# Patient Record
Sex: Male | Born: 2000 | Hispanic: Yes | Marital: Single | Smoking: Never smoker
Health system: Southern US, Community
[De-identification: ages and names within clinical notes are randomized; demographics above are authoritative.]

## PROBLEM LIST (undated history)

## (undated) DIAGNOSIS — F319 Bipolar disorder, unspecified: Secondary | ICD-10-CM

## (undated) DIAGNOSIS — F909 Attention-deficit hyperactivity disorder, unspecified type: Secondary | ICD-10-CM

## (undated) DIAGNOSIS — J45909 Unspecified asthma, uncomplicated: Secondary | ICD-10-CM

## (undated) HISTORY — PX: TONSILLECTOMY: SUR1361

## (undated) HISTORY — PX: INGUINAL HERNIA REPAIR: SUR1180

## (undated) HISTORY — PX: WISDOM TOOTH EXTRACTION: SHX21

---
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
## 2020-06-30 IMAGING — CR DG CHEST 2V
2 series · 2 of 2 positions shown · non-contrast
Comparison: May 12, 2014

CLINICAL DATA: Cough and emesis

EXAM:
CHEST - 2 VIEW

[w chest pa]
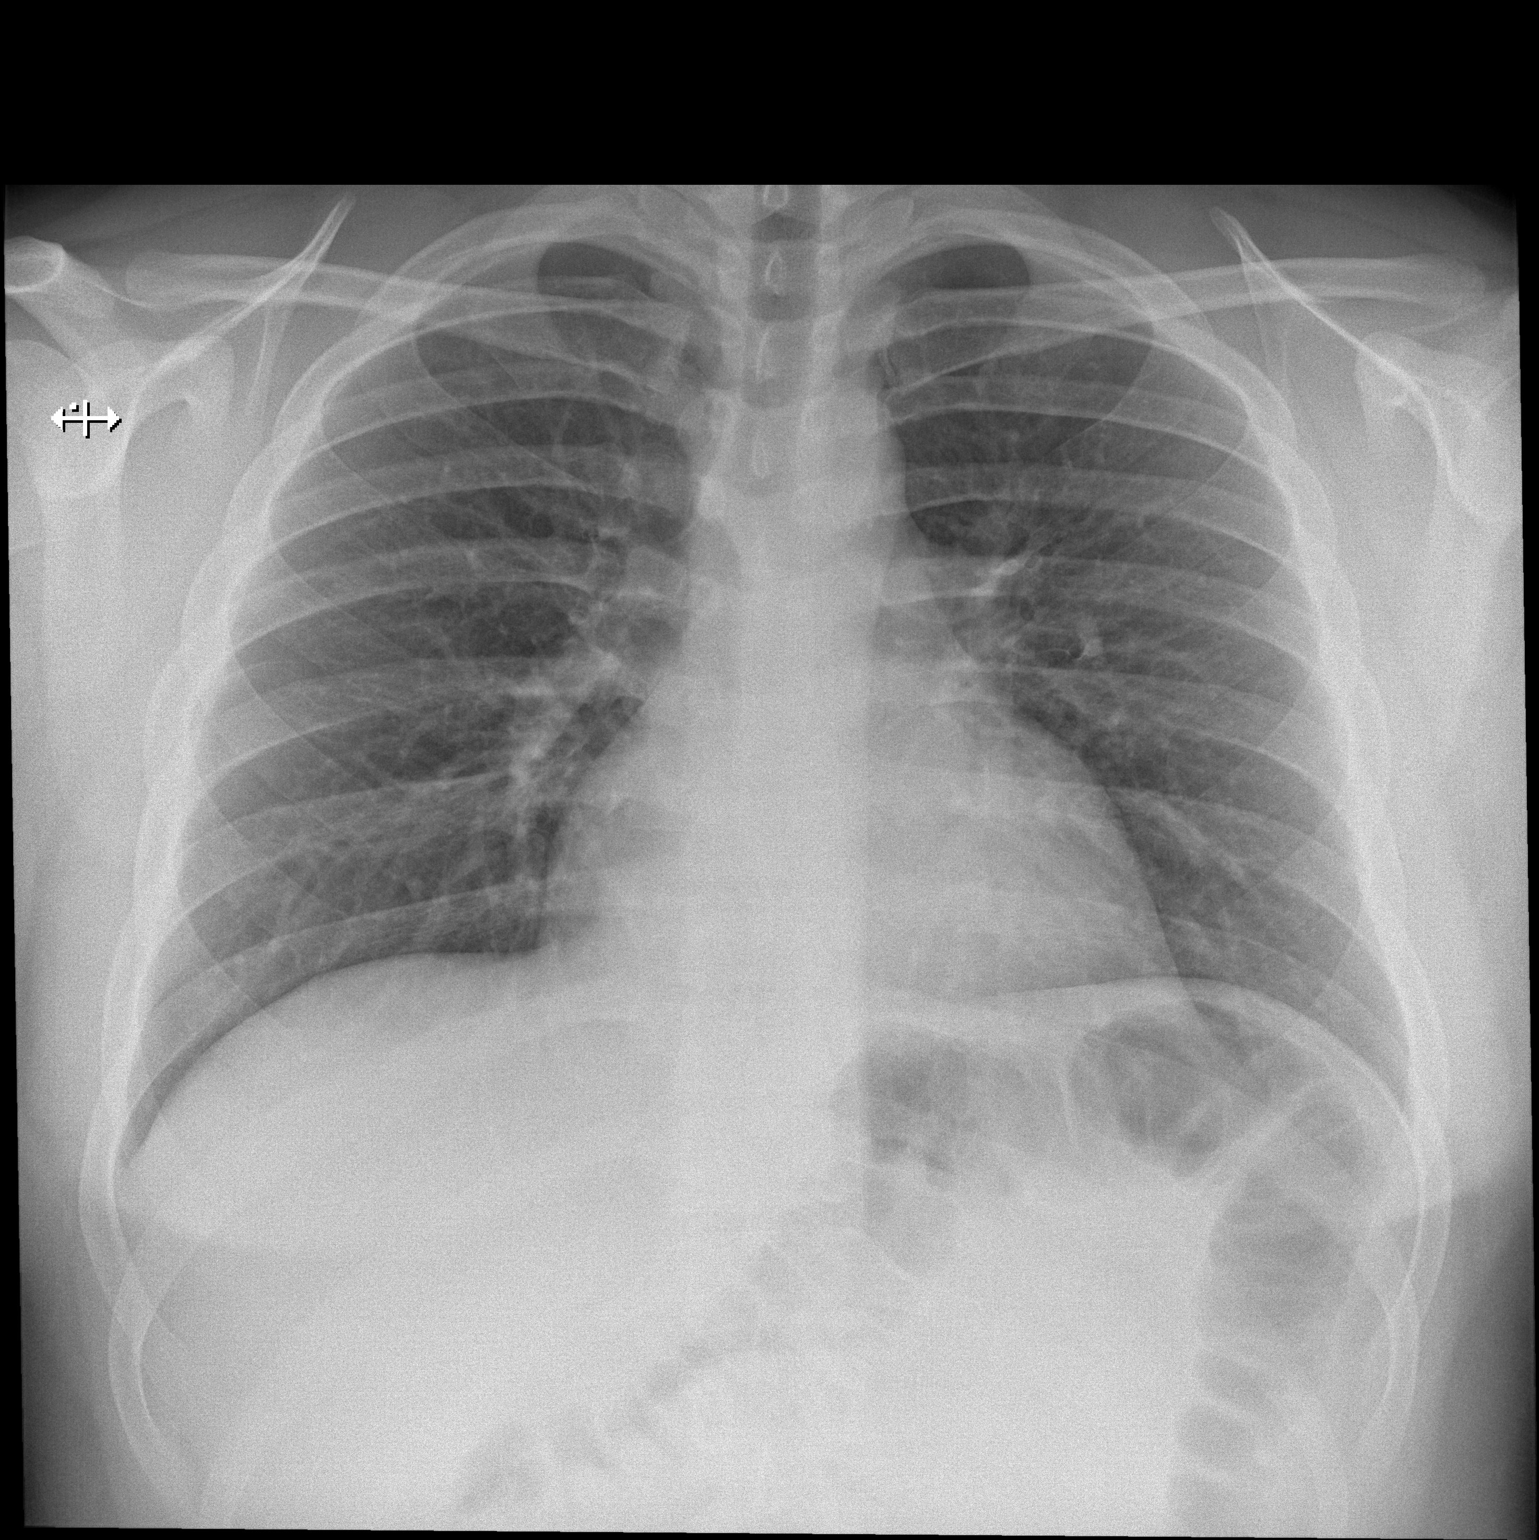

[w chest lat]
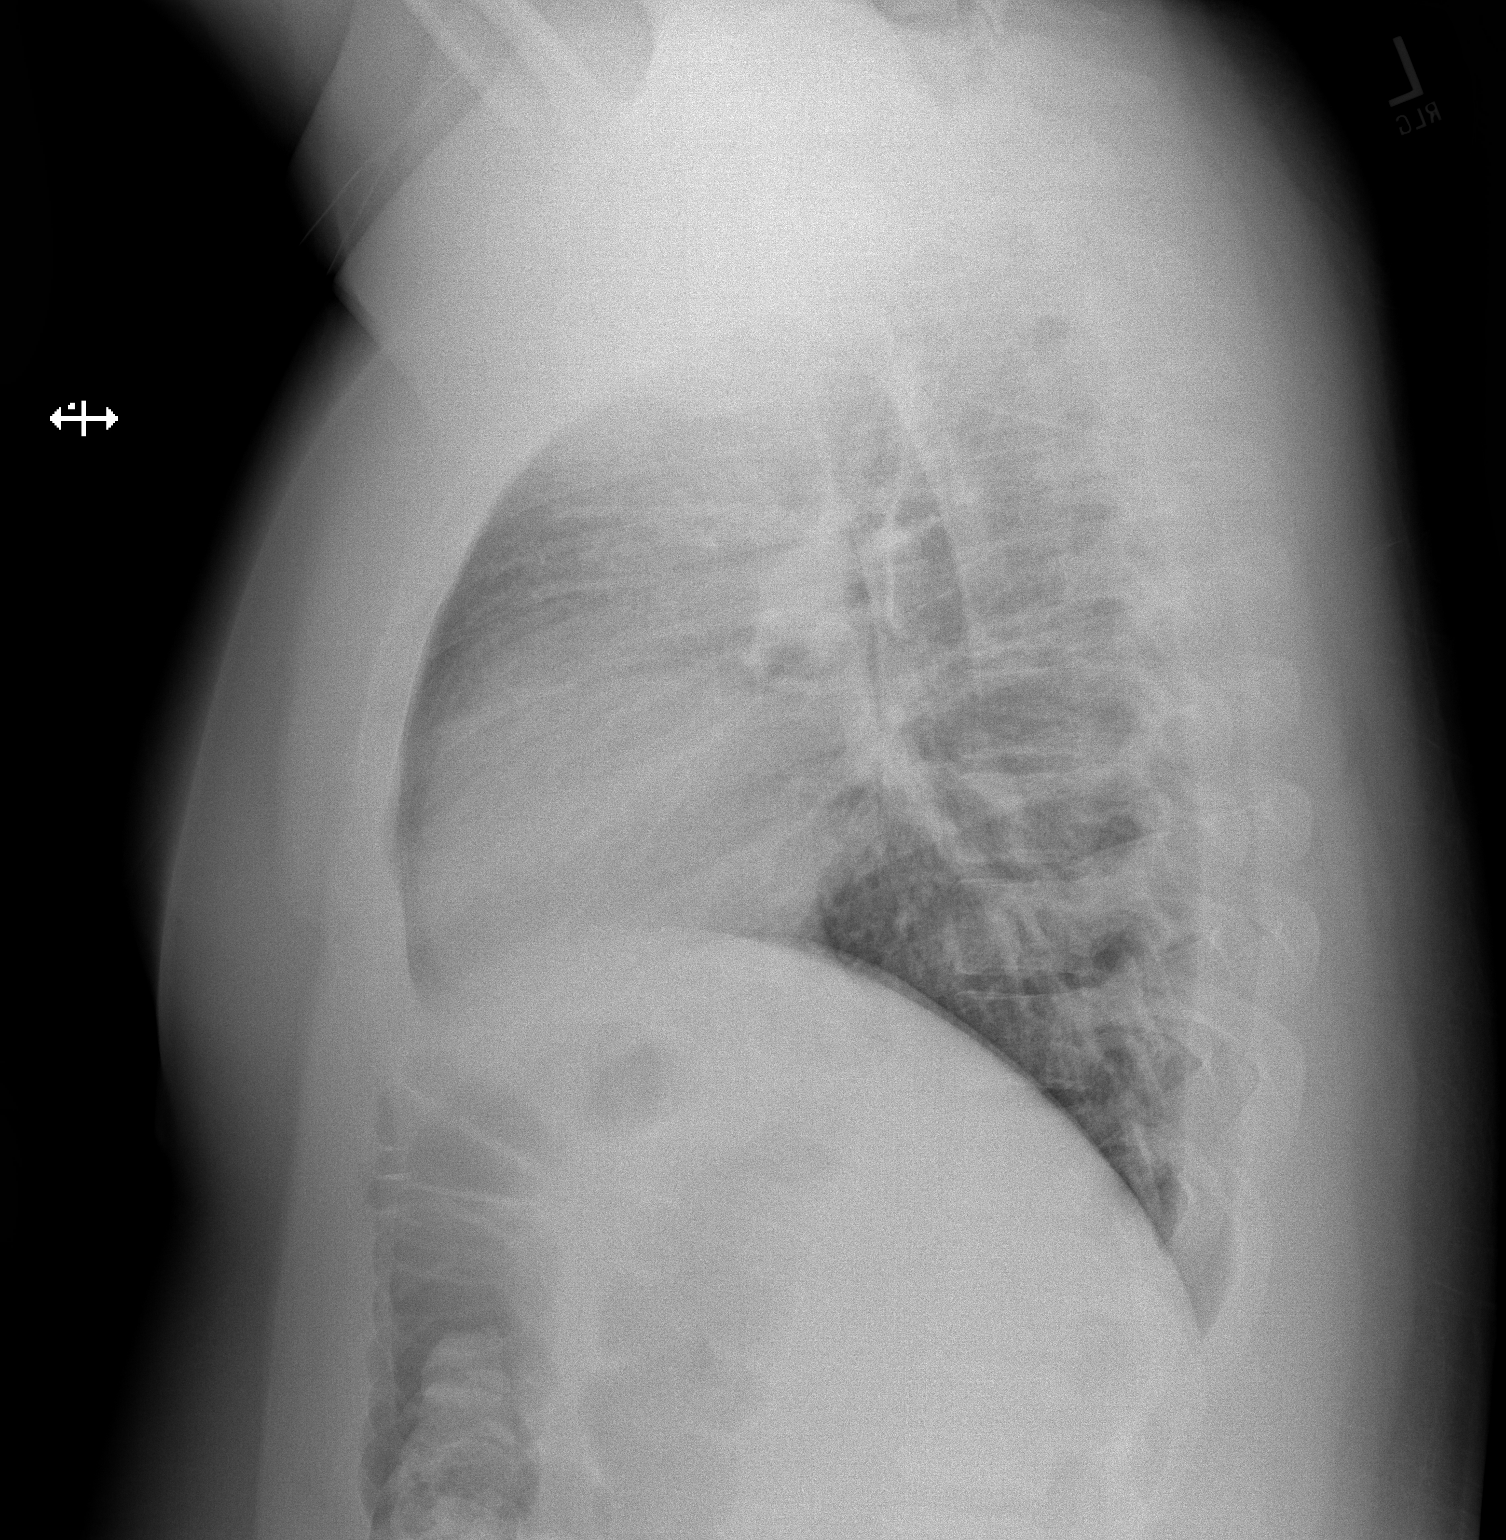

[2 of 2 positions shown; findings below may reference images not displayed]

FINDINGS: The lungs are clear. The heart size and pulmonary vascularity are
normal. No adenopathy. No bone lesions.
IMPRESSION: No edema or consolidation.

## 2021-01-31 ENCOUNTER — Other Ambulatory Visit: Payer: Self-pay

## 2021-01-31 ENCOUNTER — Encounter (HOSPITAL_BASED_OUTPATIENT_CLINIC_OR_DEPARTMENT_OTHER): Payer: Self-pay | Admitting: Emergency Medicine

## 2021-01-31 ENCOUNTER — Emergency Department (HOSPITAL_BASED_OUTPATIENT_CLINIC_OR_DEPARTMENT_OTHER)
Admission: EM | Admit: 2021-01-31 | Discharge: 2021-01-31 | Disposition: A | Discharge status: Home / Self Care | Payer: Medicaid Other | Attending: Emergency Medicine | Admitting: Emergency Medicine

## 2021-01-31 DIAGNOSIS — D72829 Elevated white blood cell count, unspecified: Secondary | ICD-10-CM | POA: Insufficient documentation

## 2021-01-31 DIAGNOSIS — Z7951 Long term (current) use of inhaled steroids: Secondary | ICD-10-CM | POA: Insufficient documentation

## 2021-01-31 DIAGNOSIS — R1084 Generalized abdominal pain: Secondary | ICD-10-CM | POA: Diagnosis present

## 2021-01-31 DIAGNOSIS — R112 Nausea with vomiting, unspecified: Secondary | ICD-10-CM | POA: Diagnosis not present

## 2021-01-31 DIAGNOSIS — J4540 Moderate persistent asthma, uncomplicated: Secondary | ICD-10-CM | POA: Insufficient documentation

## 2021-01-31 DIAGNOSIS — I10 Essential (primary) hypertension: Secondary | ICD-10-CM | POA: Insufficient documentation

## 2021-01-31 LAB — COMPREHENSIVE METABOLIC PANEL
ALT: 50 U/L — ABNORMAL HIGH (ref 0–44)
AST: 32 U/L (ref 15–41)
Albumin: 4.7 g/dL (ref 3.5–5.0)
Alkaline Phosphatase: 93 U/L (ref 38–126)
Anion gap: 9 (ref 5–15)
BUN: 13 mg/dL (ref 6–20)
CO2: 26 mmol/L (ref 22–32)
Calcium: 9.9 mg/dL (ref 8.9–10.3)
Chloride: 100 mmol/L (ref 98–111)
Creatinine, Ser: 0.92 mg/dL (ref 0.61–1.24)
GFR, Estimated: >60 mL/min (ref >60)
Glucose, Bld: 99 mg/dL (ref 70–99)
Potassium: 3.9 mmol/L (ref 3.5–5.1)
Sodium: 135 mmol/L (ref 135–145)
Total Bilirubin: 0.3 mg/dL (ref 0.3–1.2)
Total Protein: 8.5 g/dL — ABNORMAL HIGH (ref 6.5–8.1)

## 2021-01-31 LAB — CBC WITH DIFFERENTIAL/PLATELET
Abs Immature Granulocytes: 0.05 10*3/uL (ref 0.00–0.07)
Basophils Absolute: 0.1 10*3/uL (ref 0.0–0.1)
Basophils Relative: 1 %
Eosinophils Absolute: 0.4 10*3/uL (ref 0.0–0.5)
Eosinophils Relative: 3 %
HCT: 49.2 % (ref 39.0–52.0)
Hemoglobin: 16.8 g/dL (ref 13.0–17.0)
Immature Granulocytes: 0 %
Lymphocytes Relative: 38 %
Lymphs Abs: 5 10*3/uL — ABNORMAL HIGH (ref 0.7–4.0)
MCH: 30.8 pg (ref 26.0–34.0)
MCHC: 34.1 g/dL (ref 30.0–36.0)
MCV: 90.3 fL (ref 80.0–100.0)
Monocytes Absolute: 0.9 10*3/uL (ref 0.1–1.0)
Monocytes Relative: 7 %
Neutro Abs: 6.7 10*3/uL (ref 1.7–7.7)
Neutrophils Relative %: 51 %
Platelets: 321 10*3/uL (ref 150–400)
RBC: 5.45 MIL/uL (ref 4.22–5.81)
RDW: 12.9 % (ref 11.5–15.5)
WBC: 13.1 10*3/uL — ABNORMAL HIGH (ref 4.0–10.5)
nRBC: 0 % (ref 0.0–0.2)

## 2021-01-31 LAB — LIPASE, BLOOD: Lipase: 25 U/L (ref 11–51)

## 2021-01-31 MED ORDER — SODIUM CHLORIDE 0.9 % IV BOLUS
1000.0000 mL | Freq: Once | INTRAVENOUS | Status: AC
  Administered 2021-01-31: 1000 mL via INTRAVENOUS

## 2021-01-31 MED ORDER — ONDANSETRON 4 MG PO TBDP: 4.0000 mg | ORAL_TABLET | Freq: Three times a day (TID) | ORAL | 0 refills | Status: AC | PRN

## 2021-01-31 MED ORDER — PROCHLORPERAZINE EDISYLATE 10 MG/2ML IJ SOLN
10.0000 mg | Freq: Once | INTRAMUSCULAR | Status: AC
  Administered 2021-01-31: 10 mg via INTRAVENOUS
  Filled 2021-01-31: qty 2

## 2021-01-31 MED ORDER — ONDANSETRON 4 MG PO TBDP
4.0000 mg | ORAL_TABLET | Freq: Once | ORAL | Status: AC
  Administered 2021-01-31: 4 mg via ORAL
  Filled 2021-01-31: qty 1

## 2021-01-31 MED ORDER — ALUM & MAG HYDROXIDE-SIMETH 200-200-20 MG/5ML PO SUSP
30.0000 mL | Freq: Once | ORAL | Status: AC
  Administered 2021-01-31: 30 mL via ORAL
  Filled 2021-01-31: qty 30

## 2021-01-31 NOTE — Discharge Instructions (Signed)
Recommend recheck with primary doctor.  Come back to ER if you have develop any worsening abdominal pain, vomiting or other new concerning symptom.

## 2021-01-31 NOTE — ED Notes (Signed)
Pt reports IV "hurting", requests IV to be removed. IV fluids stopped, IV removed.

## 2021-01-31 NOTE — ED Provider Notes (Signed)
Sign out note  20 year old male presenting to ER with concern for abdominal pain.  Nontender exam.  Basic labs ordered.  7:00 AM Received signout from [facility]  10:00 AM reassessed patient, no ongoing pain.  Tolerating p.o.  Labs noted for mild leukocytosis.  No abdominal tenderness on exam.  Suspect may have viral process.  Given well appearance, work-up thus far, believe he is appropriate for discharge outpatient management.  Reviewed complications of discharge.     Milagros Loll, MD 01/31/21 1023

## 2021-01-31 NOTE — ED Provider Notes (Signed)
[facility] EMERGENCY DEPARTMENT Provider Note  CSN: [id number] Arrival date & time: 01/31/21 0248  Chief Complaint(s) Vomiting  HPI Trevor Walsh. is a 20 y.o. male here for nausea and nonbloody nonbilious emesis that began 1 hour ago.  Trevor Walsh was awakened from sleep.  Trevor Walsh has subsequently developed periumbilical abdominal pain attributed to Trevor emesis.  Pain has subsided.  Described as aching.  Worse with emesis.  Trevor Walsh tried taking Zofran given by his mother with minimal relief.  No other alleviating or aggravating factors.  No recent fevers or infections.  No coughing or congestion.  No diarrhea.  Trevor Walsh reports eating a [place] sub from Trevor grocery store earlier in Trevor evening.  No other suspicious food intake.  No recent travel.  Trevor history is provided by Trevor Walsh.   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 Authorizing Provider  ondansetron (ZOFRAN ODT) 4 MG disintegrating tablet Take 1 tablet (4 mg total) by mouth every 8 (eight) hours as needed for nausea or vomiting. 01/31/21  Yes Milagros Loll, MD  albuterol (PROAIR HFA) 108 (90 Base) MCG/ACT inhaler  Inhale 2 puffs into Trevor lungs every 4 (four) hours as needed for wheezing or shortness of breath. 07/06/19   Marcelyn Bruins, MD  ammonium lactate (AMLACTIN) 12 % lotion Apply 1 application topically as needed for dry skin. 12/25/18   Marcelyn Bruins, MD  budesonide-formoterol [facility]) 160-4.5 MCG/ACT inhaler Inhale 2 puffs into Trevor lungs 2 (two) times daily. Trevor Walsh not taking: No sig reported 07/31/18   Marcelyn Bruins, MD  cetirizine (ZYRTEC) 10 MG tablet Take 1 tablet (10 mg total) by mouth daily as needed for allergies. 12/25/18   Marcelyn Bruins, MD  EPINEPHrine (EPIPEN 2-PAK) 0.3 mg/0.3 mL IJ SOAJ injection Inject 0.3 mLs (0.3 mg total) into Trevor muscle as directed. Trevor Walsh taking differently: Inject 0.3 mg into Trevor muscle as needed (allergic reaction).  08/15/17   Ambs, Norvel Richards, FNP  fluticasone (FLONASE) 50 MCG/ACT nasal spray Place 1 spray into both nostrils 2 (two) times daily as needed for allergies or rhinitis. 09/05/18   Marcelyn Bruins, MD  fluticasone (FLOVENT HFA) 110 MCG/ACT inhaler Inhale 2 puffs into Trevor lungs 2 (two) times daily. Trevor Walsh not taking: No sig reported 07/31/18   Marcelyn Bruins, MD  montelukast (SINGULAIR) 10 MG tablet Take 1 tablet (10 mg total) by mouth daily. Trevor Walsh not taking: Reported on 12/25/2018 07/31/18   Marcelyn Bruins, MD  pantoprazole (PROTONIX) 20 MG tablet Take 1 tablet (20 mg total) by mouth daily. 12/25/18   Marcelyn Bruins, MD  PAZEO 0.7 % SOLN Apply 1 drop to eye daily. 09/11/18   Marcelyn Bruins, MD  Past Surgical History Past Surgical History:  Procedure Laterality Date   INGUINAL HERNIA REPAIR Right    INGUINAL HERNIA REPAIR Right    TONSILLECTOMY     TONSILLECTOMY  2010   WISDOM TOOTH EXTRACTION     Family History Family History   Problem Relation Age of Onset   Diabetes Mother    Hypertension Mother    Hyperlipidemia Mother    Asthma Mother    Diabetes Maternal Grandmother    Asthma Maternal Grandmother    Diabetes Paternal Grandmother    Asthma Sister    Asthma Brother    Asthma Sister    Asthma Sister    Asthma Sister    Asthma Brother    Allergic rhinitis Neg Hx    Angioedema Neg Hx    Eczema Neg Hx    Immunodeficiency Neg Hx    Urticaria Neg Hx     Social History Social History   Tobacco Use   Smoking status: Never   Smokeless tobacco: Never  Vaping Use   Vaping Use: Never used  Substance Use Topics   Alcohol use: No   Drug use: No   Allergies Lactose, Lactose intolerance (gi), and Other  Review of Systems Review of Systems All other systems are reviewed and are negative for acute change except as noted in Trevor HPI  Physical Exam Vital Signs  I have reviewed Trevor triage vital signs BP (!) 149/90   Pulse (!) 114   Temp 98.2 F (36.8 C) (Oral)   Resp 18   Ht 5\' 9"  (1.753 m)   Wt 123.4 kg   SpO2 99%   BMI 40.17 kg/m   Physical Exam Vitals reviewed.  Constitutional:      General: Trevor Walsh is not in acute distress.    Appearance: Trevor Walsh is well-developed. Trevor Walsh is not diaphoretic.  HENT:     Head: Normocephalic and atraumatic.     Right Ear: External ear normal.     Left Ear: External ear normal.     Nose: Nose normal.     Mouth/Throat:     Mouth: Mucous membranes are moist.  Eyes:     General: No scleral icterus.    Conjunctiva/sclera: Conjunctivae normal.  Neck:     Trachea: Phonation normal.  Cardiovascular:     Rate and Rhythm: Normal rate and regular rhythm.  Pulmonary:     Effort: Pulmonary effort is normal. No respiratory distress.     Breath sounds: No stridor.  Abdominal:     General: There is no distension.     Tenderness: There is abdominal tenderness (mild) in Trevor periumbilical area. There is no guarding or rebound. Negative signs include Murphy's sign and McBurney's  sign.     Hernia: No hernia is present.  Musculoskeletal:        General: Normal range of motion.     Cervical back: Normal range of motion.  Neurological:     Mental Status: Trevor Walsh is alert and oriented to person, place, and time.  Psychiatric:        Behavior: Behavior normal.    ED Results and Treatments Labs (all labs ordered are listed, but only abnormal results are displayed) Labs Reviewed  CBC WITH DIFFERENTIAL/PLATELET - Abnormal; Notable for Trevor following components:      Result Value   WBC 13.1 (*)    Lymphs Abs 5.0 (*)    All other components within normal limits  COMPREHENSIVE METABOLIC PANEL - Abnormal; Notable for Trevor following components:  Total Protein 8.5 (*)    ALT 50 (*)    All other components within normal limits  LIPASE, BLOOD                                                                                                                         EKG  EKG Interpretation  Date/Time:    Ventricular Rate:    PR Interval:    QRS Duration:   QT Interval:    QTC Calculation:   R Axis:     Text Interpretation:         Radiology No results found.  Pertinent labs & imaging results that were available during my Walsh of Trevor Walsh were reviewed by me and considered in my medical decision making (see MDM for details).  Medications Ordered in ED Medications  ondansetron (ZOFRAN-ODT) disintegrating tablet 4 mg (4 mg Oral Given 01/31/21 0306)  alum & mag hydroxide-simeth (MAALOX/MYLANTA) 200-200-20 MG/5ML suspension 30 mL (30 mLs Oral Given 01/31/21 0747)  prochlorperazine (COMPAZINE) injection 10 mg (10 mg Intravenous Given 01/31/21 0747)  sodium chloride 0.9 % bolus 1,000 mL (0 mLs Intravenous Stopped 01/31/21 0844)                                                                                                                                     Procedures Procedures  (including critical Walsh time)  Medical Decision Making / ED Course I have reviewed Trevor  nursing notes for this encounter and Trevor Walsh's prior records (if available in EHR or on provided paperwork).  Jontavius Rabalais. was evaluated in Emergency Department on 01/31/2021 for Trevor symptoms described in Trevor history of present illness. Trevor Walsh was evaluated in Trevor context of Trevor global COVID-19 pandemic, which necessitated consideration that Trevor Walsh might be at risk for infection with Trevor SARS-CoV-2 virus that causes COVID-19. Institutional protocols and algorithms that pertain to Trevor evaluation of patients at risk for COVID-19 are in a state of rapid change based on information released by regulatory bodies including Trevor CDC and federal and state organizations. These policies and algorithms were followed during Trevor Walsh's Walsh in Trevor ED.     20 y.o. male presents with vomiting and abdominal pain. Possible suspicious food intake.  Rest of history as above.  Trevor Walsh appears well, not in distress, and with no signs of toxicity or dehydration. Abdomen with mild periumbilical discomfort  to palpation without signs of peritonitis.  Rest of Trevor exam as above  Most consistent with viral gastroenteritis vs food poisoning.   Will obtain screening labs and provide Trevor Walsh with symptomatic treatment. Plan to reassess  Trevor Walsh turned over to oncoming provider. Trevor Walsh case and results discussed in detail; please see their note for further ED managment.     Final Clinical Impression(s) / ED Diagnoses Final diagnoses:  Generalized abdominal pain  Leukocytosis, unspecified type     This chart was dictated using voice recognition software.  Despite best efforts to proofread,  errors can occur which can change Trevor documentation meaning.    Nira Conn, MD 01/31/21 1745

## 2021-01-31 NOTE — ED Notes (Signed)
Message to provider ragarding IV, pt c/o pain, asked to have IV removed. IV removed, pt tolerated well

## 2021-01-31 NOTE — ED Notes (Signed)
Pt has not vomited since zofran. Pt given water for PO trial.

## 2021-01-31 NOTE — ED Triage Notes (Signed)
Pt reports awaking around 1 our ago with vomiting and now has abd pain. Denies diarrhea.

## 2021-01-31 NOTE — ED Notes (Signed)
Pt ambulatory with steady gait to restroom, will provide urine specimen 

## 2021-01-31 NOTE — ED Notes (Signed)
Pts mother called for update, unable to pick up call.

## 2021-09-04 DEATH — deceased
